# Patient Record
Sex: Female | Born: 1960 | Race: White | Hispanic: No | Marital: Married
Health system: Southern US, Community
[De-identification: ages and names within clinical notes are randomized; demographics above are authoritative.]

---

## 2009-08-29 ENCOUNTER — Emergency Department (HOSPITAL_COMMUNITY): Admission: EM | Admit: 2009-08-29 | Discharge: 2009-08-29 | Payer: Self-pay | Admitting: Emergency Medicine

## 2012-04-06 ENCOUNTER — Telehealth: Payer: Self-pay

## 2012-04-06 NOTE — Telephone Encounter (Signed)
Pt wants to change medicine

## 2015-11-29 DIAGNOSIS — M2550 Pain in unspecified joint: Secondary | ICD-10-CM | POA: Insufficient documentation

## 2015-11-29 DIAGNOSIS — R202 Paresthesia of skin: Secondary | ICD-10-CM | POA: Insufficient documentation

## 2016-04-01 ENCOUNTER — Encounter: Payer: Self-pay | Admitting: Podiatry

## 2016-04-01 ENCOUNTER — Ambulatory Visit (HOSPITAL_BASED_OUTPATIENT_CLINIC_OR_DEPARTMENT_OTHER)
Admission: RE | Admit: 2016-04-01 | Discharge: 2016-04-01 | Disposition: A | Discharge status: Home / Self Care | Payer: BLUE CROSS/BLUE SHIELD | Source: Ambulatory Visit | Attending: Podiatry | Admitting: Podiatry

## 2016-04-01 ENCOUNTER — Ambulatory Visit (INDEPENDENT_AMBULATORY_CARE_PROVIDER_SITE_OTHER): Payer: BLUE CROSS/BLUE SHIELD | Admitting: Podiatry

## 2016-04-01 VITALS — BP 164/97 | HR 64 | Resp 16

## 2016-04-01 DIAGNOSIS — M19072 Primary osteoarthritis, left ankle and foot: Secondary | ICD-10-CM | POA: Diagnosis not present

## 2016-04-01 DIAGNOSIS — M2011 Hallux valgus (acquired), right foot: Secondary | ICD-10-CM | POA: Insufficient documentation

## 2016-04-01 DIAGNOSIS — M19071 Primary osteoarthritis, right ankle and foot: Secondary | ICD-10-CM | POA: Diagnosis not present

## 2016-04-01 DIAGNOSIS — M79671 Pain in right foot: Secondary | ICD-10-CM | POA: Insufficient documentation

## 2016-04-01 DIAGNOSIS — M85871 Other specified disorders of bone density and structure, right ankle and foot: Secondary | ICD-10-CM | POA: Insufficient documentation

## 2016-04-01 DIAGNOSIS — M79672 Pain in left foot: Secondary | ICD-10-CM

## 2016-04-01 DIAGNOSIS — M7989 Other specified soft tissue disorders: Secondary | ICD-10-CM | POA: Diagnosis not present

## 2016-04-01 DIAGNOSIS — M2012 Hallux valgus (acquired), left foot: Secondary | ICD-10-CM | POA: Diagnosis not present

## 2016-04-01 DIAGNOSIS — M2010 Hallux valgus (acquired), unspecified foot: Secondary | ICD-10-CM | POA: Diagnosis not present

## 2016-04-01 NOTE — Progress Notes (Addendum)
Subjective:    Patient ID: Bonnie Suarez, female    DOB: 1960-09-22, 55 y.o.   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 She states the area gets red and painful mostly shoes and she has tried changing her shoes and offloading of the any significant relief in symptoms. She will to discuss surgical invention have the surgery done around Christmas. She denies any numbness or tingling to her toes at this time.  She has a history of rheumatoid arthritis she does not take any medication for this.   Review of Systems  All other systems reviewed and are negative.      Objective:   Physical Exam  General: AAO x3, NAD  Dermatological: Mild erythema to the medial aspect of the first metatarsal head on the right greater than left foot on the bunion site. There is no erythema otherwise or increase in warmth. No fluctuance or crepitus. No signs of infection and this is likely from irritation in shoes. Nails x 10 are well manicured; remaining integument appears unremarkable at this time. There are no open sores, no preulcerative lesions, no rash or signs of infection present.  Vascular: Dorsalis Pedis artery and Posterior Tibial artery pedal pulses are 2/4 bilateral with immedate capillary fill time. Pedal hair growth present. There is no pain with calf compression, swelling, warmth, erythema.   Neruologic: Grossly intact via light touch bilateral. Vibratory intact via tuning fork bilateral. Protective threshold with Semmes Wienstein monofilament intact to all pedal sites bilateral.   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 There is tenderness to palpation on the medial aspect of the first metatarsal head bilaterally right side worse left on the bunion site. Mild decreased range of motion the first MTPJ. There is no crepitation.  No pain to range of motion. No hypermobility is present. There is no other areas of tenderness bilaterally. No area pinpoint tenderness. MMT 5/5.  Gait: Unassisted, Nonantalgic.     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 -I discussed both conservative and surgical treatment options. Continue with shoe gear changes as well as offloading pad the meantime.-She would also like to discuss surgical intervention as she was given proceed with this. After discussion with her discussed with her Liane Comber bunionectomy with possible Akin on the right side. I discussed with the surgery as well as the postoperative course was is not a guarantee relief of symptoms and she understood this and wished to proceed. Discussed that he get in the met adductus she may not get perfect correction. -The incision placement as well as the postoperative course was discussed with the patient. I discussed risks of the surgery which include, but not limited to, infection, bleeding, pain, swelling, need for further surgery, delayed or nonhealing, painful or ugly scar, numbness or sensation changes, over/under correction, recurrence, transfer lesions, further deformity, hardware failure, DVT/PE, loss of toe/foot. Patient understands these risks and wishes to proceed with surgery. The surgical consent was reviewed with the patient all 3 pages were signed. No promises or guarantees were given to the outcome of the procedure. All questions were answered to the best of my ability. Before the surgery the  patient was encouraged to call the office if there is any further questions. The surgery will be performed at the [facility] on an outpatient basis. -CAM boot dispensed to bring to surgery   Celesta Gentile, DPM

## 2016-04-01 NOTE — Patient Instructions (Signed)

## 2016-06-04 ENCOUNTER — Encounter: Payer: Self-pay | Admitting: Podiatry

## 2016-06-04 DIAGNOSIS — M2011 Hallux valgus (acquired), right foot: Secondary | ICD-10-CM | POA: Diagnosis not present

## 2016-06-13 ENCOUNTER — Ambulatory Visit (INDEPENDENT_AMBULATORY_CARE_PROVIDER_SITE_OTHER): Payer: BLUE CROSS/BLUE SHIELD

## 2016-06-13 ENCOUNTER — Ambulatory Visit (INDEPENDENT_AMBULATORY_CARE_PROVIDER_SITE_OTHER): Payer: BLUE CROSS/BLUE SHIELD | Admitting: Podiatry

## 2016-06-13 DIAGNOSIS — M79671 Pain in right foot: Secondary | ICD-10-CM

## 2016-06-13 DIAGNOSIS — M2010 Hallux valgus (acquired), unspecified foot: Secondary | ICD-10-CM

## 2016-06-13 DIAGNOSIS — M21611 Bunion of right foot: Secondary | ICD-10-CM

## 2016-06-13 DIAGNOSIS — M79672 Pain in left foot: Secondary | ICD-10-CM

## 2016-06-13 NOTE — Progress Notes (Signed)
Subjective: Bonnie Suarez is a 55 y.o. is seen today in office s/p right Austin/Akin bunionectomy preformed on 06/04/16. They state their pain is controlled and not taking pain medication. Denies any systemic complaints such as fevers, chills, nausea, vomiting. No calf pain, chest pain, shortness of breath.   Objective: General: No acute distress, AAOx3  DP/PT pulses palpable 2/4, CRT < 3 sec to all digits.  Protective sensation intact. Motor function intact.  Right foot: Incision is well coapted without any evidence of dehiscence and sutures are intact. There is no surrounding erythema, ascending cellulitis, fluctuance, crepitus, malodor, drainage/purulence. There is mild edema around the surgical site. There is minimal pain along the surgical site. Toe is in rectus position. She states "I am just happy to see my toe straight".  No other areas of tenderness to bilateral lower extremities.  No other open lesions or pre-ulcerative lesions.  No pain with calf compression, swelling, warmth, erythema.   Assessment and Plan:  Status post right foot surgery, doing well with no complications   -Treatment options discussed including all alternatives, risks, and complications -X-rays were obtained and reviewed with the patient. Status post bunion correction. Hardware intact. No evidence of acute fracture. -Antibiotic on it was applied followed by a bandage. Keep dressing clean, dry, intact -Continue cam boot. Weightbearing as tolerated. -Hold off on getting incision wet. -Ice/elevation -Pain medication as needed. -Monitor for any clinical signs or symptoms of infection and DVT/PE and directed to call the office immediately should any occur or go to the ER. -Follow-up in 1 week or sooner if any problems arise. In the meantime, encouraged to call the office with any questions, concerns, change in symptoms.   Ovid Curd, DPM

## 2016-06-20 ENCOUNTER — Ambulatory Visit (INDEPENDENT_AMBULATORY_CARE_PROVIDER_SITE_OTHER): Payer: Self-pay | Admitting: Podiatry

## 2016-06-20 ENCOUNTER — Encounter: Payer: Self-pay | Admitting: Podiatry

## 2016-06-20 DIAGNOSIS — Z9889 Other specified postprocedural states: Secondary | ICD-10-CM

## 2016-06-20 DIAGNOSIS — M2011 Hallux valgus (acquired), right foot: Secondary | ICD-10-CM

## 2016-06-20 NOTE — Progress Notes (Signed)
Subjective: Bonnie Suarez is a 56 y.o. is seen today in office s/p right Austin/Akin bunionectomy preformed on 06/04/16. She presents today for suture removal .They state their pain is controlled and not taking pain medication. Denies any systemic complaints such as fevers, chills, nausea, vomiting. No calf pain, chest pain, shortness of breath.   Objective: General: No acute distress, AAOx3  DP/PT pulses palpable 2/4, CRT < 3 sec to all digits.  Protective sensation intact. Motor function intact.  Right foot: Incision is well coapted without any evidence of dehiscence and sutures ends are intact. There is no surrounding erythema, ascending cellulitis, fluctuance, crepitus, malodor, drainage/purulence. There is mild edema around the surgical site however this appears improved. There is no pain along the surgical site. Toe is in rectus position. Mild restriction of MPJ range of motion however there is no pain with range of motion. No other areas of tenderness to bilateral lower extremities.  No other open lesions or pre-ulcerative lesions.  No pain with calf compression, swelling, warmth, erythema.   Assessment and Plan:  Status post right foot surgery, doing well with no complications   -Treatment options discussed including all alternatives, risks, and complications -Suture ends were cut today. Antibiotic ointment was applied followed by a bandage. She can continue this at home. She concerned shower tomorrow. -Continue cam boot. WBAT -Ice/elevation -Pain medication as needed. -Darco splint to help hold the toe straight.  -Monitor for any clinical signs or symptoms of infection and DVT/PE and directed to call the office immediately should any occur or go to the ER. -Follow-up in 2 weeks or sooner if any problems arise. In the meantime, encouraged to call the office with any questions, concerns, change in symptoms.   *likely surgical shoe next appointment. X-ray next appointment   Ovid Curd, DPM

## 2016-06-25 ENCOUNTER — Encounter: Payer: Self-pay | Admitting: Podiatry

## 2016-06-26 ENCOUNTER — Telehealth: Payer: Self-pay | Admitting: *Deleted

## 2016-06-26 NOTE — Telephone Encounter (Addendum)
Pt requested note to return to work and fax to [phone]. 06/26/2016-Left message to call with the information needed to return to work.

## 2016-06-26 NOTE — Telephone Encounter (Signed)
OK to return to work but needs to wear boot and try to ice/elevate as much as possible. Limit walking

## 2016-07-04 ENCOUNTER — Ambulatory Visit (INDEPENDENT_AMBULATORY_CARE_PROVIDER_SITE_OTHER): Payer: BLUE CROSS/BLUE SHIELD

## 2016-07-04 ENCOUNTER — Ambulatory Visit (INDEPENDENT_AMBULATORY_CARE_PROVIDER_SITE_OTHER): Payer: BLUE CROSS/BLUE SHIELD | Admitting: Podiatry

## 2016-07-04 DIAGNOSIS — M2011 Hallux valgus (acquired), right foot: Secondary | ICD-10-CM

## 2016-07-04 DIAGNOSIS — Z9889 Other specified postprocedural states: Secondary | ICD-10-CM

## 2016-07-04 NOTE — Progress Notes (Signed)
Subjective: Shizuye Rupert is a 56 y.o. is seen today in office s/p right Austin/Akin bunionectomy preformed on 06/04/16. She presents today for routine follow-up. She has not been taking any pain medication and she states she is doing well. Denies any systemic complaints such as fevers, chills, nausea, vomiting. No calf pain, chest pain, shortness of breath.   Objective: General: No acute distress, AAOx3  DP/PT pulses palpable 2/4, CRT < 3 sec to all digits.  Protective sensation intact. Motor function intact.  Right foot: Incision is well coapted without any evidence of dehiscence and a scar has formed. There is no surrounding erythema, ascending cellulitis, fluctuance, crepitus, malodor, drainage/purulence. There is minimal edema around the surgical site. There is no pain along the surgical site. Toe is in rectus position. She thinks that "it looks really good".  Improvement in 1st MTPJ ROM. No clinical signs of infection.  No other areas of tenderness to bilateral lower extremities.  No other open lesions or pre-ulcerative lesions.  No pain with calf compression, swelling, warmth, erythema.       Assessment and Plan:  Status post right foot surgery, doing well with no complications   -Treatment options discussed including all alternatives, risks, and complications -X-rays were obtained and reviewed with the patient. Hardware intact. Increased consolidation across the osteotomy sites.  -Continue ROM exercises 1st MTPJ -Transition to a surgical toe today. -She is not taking any pain medication.  -Ice/elevation.  -Continue darco splint.  -Monitor for any clinical signs or symptoms of infection and DVT/PE and directed to call the office immediately should any occur or go to the ER. -Follow-up in 2 weeks or sooner if any problems arise. In the meantime, encouraged to call the office with any questions, concerns, change in symptoms.   *likely regular shoe next appointment. X-ray next  appointment   Ovid Curd, DPM

## 2016-07-14 NOTE — Progress Notes (Signed)
DOS 12.20.2017 Right foot surgical correction of bunion deformity (austin bunionectomy, possible aiken.)

## 2016-07-17 DIAGNOSIS — Z79899 Other long term (current) drug therapy: Secondary | ICD-10-CM | POA: Insufficient documentation

## 2016-07-18 ENCOUNTER — Ambulatory Visit (INDEPENDENT_AMBULATORY_CARE_PROVIDER_SITE_OTHER): Payer: BLUE CROSS/BLUE SHIELD

## 2016-07-18 ENCOUNTER — Ambulatory Visit (INDEPENDENT_AMBULATORY_CARE_PROVIDER_SITE_OTHER): Payer: Self-pay | Admitting: Podiatry

## 2016-07-18 DIAGNOSIS — M21611 Bunion of right foot: Secondary | ICD-10-CM | POA: Diagnosis not present

## 2016-07-18 DIAGNOSIS — M2011 Hallux valgus (acquired), right foot: Secondary | ICD-10-CM

## 2016-07-18 NOTE — Progress Notes (Signed)
Subjective: Bonnie Suarez is a 56 y.o. is seen today in office s/p right Austin/Akin bunionectomy preformed on 06/04/16.She states that she is doing well and this "is the easiest thing I have done". She states she is very happy with the results. She is back to a regular shoe without any issues. She is still working on the big toe ROM and it has been improving.  Denies any systemic complaints such as fevers, chills, nausea, vomiting. No calf pain, chest pain, shortness of breath.   Objective: General: No acute distress, AAOx3  DP/PT pulses palpable 2/4, CRT < 3 sec to all digits.  Protective sensation intact. Motor function intact.  Right foot: Incision is well coapted without any evidence of dehiscence and a scar has formed. There is no surrounding erythema, ascending cellulitis, fluctuance, crepitus, malodor, drainage/purulence. There is minimal edema around the surgical site. There is no pain along the surgical site. Toe is in rectus position. Mild decrease in 1st MTPJ ROM but it is improving.  No other areas of tenderness to bilateral lower extremities.  No other open lesions or pre-ulcerative lesions.  No pain with calf compression, swelling, warmth, erythema.    Assessment and Plan:  Status post right foot surgery, doing well with no complications   -Treatment options discussed including all alternatives, risks, and complications -X-rays were obtained and reviewed with the patient. Hardware intact. Increased consolidation across the osteotomy sites.  -Continue ROM exercises 1st MTPJ -Continue with supportive shoe gear and gradually increase activity.  -Ice/elevation.  -Monitor for any clinical signs or symptoms of infection and DVT/PE and directed to call the office immediately should any occur or go to the ER. -At this time she is doing well and I am going to discharge her from the postoperative care. She would like to have her other foot done this summer. We will see her back in the next  couple of months to schedule this.  -Follow-up in 2 weeks or sooner if any problems arise. In the meantime, encouraged to call the office with any questions, concerns, change in symptoms.   Ovid Curd, DPM

## 2016-11-06 ENCOUNTER — Ambulatory Visit (INDEPENDENT_AMBULATORY_CARE_PROVIDER_SITE_OTHER): Payer: BLUE CROSS/BLUE SHIELD

## 2016-11-06 ENCOUNTER — Encounter: Payer: Self-pay | Admitting: Podiatry

## 2016-11-06 ENCOUNTER — Ambulatory Visit (INDEPENDENT_AMBULATORY_CARE_PROVIDER_SITE_OTHER): Payer: BLUE CROSS/BLUE SHIELD | Admitting: Podiatry

## 2016-11-06 DIAGNOSIS — M21619 Bunion of unspecified foot: Secondary | ICD-10-CM

## 2016-11-06 DIAGNOSIS — M2011 Hallux valgus (acquired), right foot: Secondary | ICD-10-CM | POA: Diagnosis not present

## 2016-11-06 NOTE — Patient Instructions (Signed)

## 2016-11-12 NOTE — Progress Notes (Signed)
Subjective: 56 year old female presents the office today for concerns of possible recurrence of bunion deformity. She previously underwent a bunion correction on June 04, 2016. She states she has not really had any problems with her bunion all this that she is somewhat gray shoe she has noticed a slight prominence on the bunion site. She is happy with the position of the toe but she feels that there is some bone starting to reform that she is concerned about. Denies any systemic complaints such as fevers, chills, nausea, vomiting. No acute changes since last appointment, and no other complaints at this time.   Objective: AAO x3, NAD DP/PT pulses palpable bilaterally, CRT less than 3 seconds Overall there is a very minimal bunion present on the right foot. The incision from the prior surgery is well-healed. Mild dorsal exostosis palpable off of the dorsal first metatarsal head. There is minimal tenderness palpation. There is no pain with first MTPJ range of motion however some mild restriction. No open lesions or pre-ulcerative lesions.  No open lesions or pre-ulcerative lesions. No pain with calf compression, swelling, warmth, erythema  Assessment: Mild recurrence bunion deformity right foot  Plan: -All treatment options discussed with the patient including all alternatives, risks, complications.  -X-rays were obtained and reviewed. Hardware intact. Mild arthritic changes present first MPJ. -I discussed the treatment options including both conservative and surgical treatment options. This point she wishes to proceed with surgery to remove the hardware as well as to shave down the excess bone off the dorsal as well as the medial aspect of the first metatarsal head. I discussed with her the risks of surgery and is also chances can still come back in the future and she understands this and wishes to proceed. -The incision placement as well as the postoperative course was discussed with the patient.  I discussed risks of the surgery which include, but not limited to, infection, bleeding, pain, swelling, need for further surgery, delayed or nonhealing, painful or ugly scar, numbness or sensation changes, over/under correction, recurrence, transfer lesions, further deformity, hardware failure, DVT/PE, loss of toe/foot. Patient understands these risks and wishes to proceed with surgery. The surgical consent was reviewed with the patient all 3 pages were signed. No promises or guarantees were given to the outcome of the procedure. All questions were answered to the best of my ability. Before the surgery the patient was encouraged to call the office if there is any further questions. The surgery will be performed at the [facility] on an outpatient basis. -Patient encouraged to call the office with any questions, concerns, change in symptoms.   Ovid Curd, DPM

## 2016-12-29 ENCOUNTER — Telehealth: Payer: Self-pay | Admitting: *Deleted

## 2016-12-29 NOTE — Telephone Encounter (Signed)
"  I want to see if Dr. Ardelle Anton has any time available at the end of August to schedule surgery."  Yes, he can do it on August 29.  "Can you go ahead and put me down for that date?"  I will get it scheduled.  Someone from the surgical center will give you a call with the arrival time.  You can register with the surgical center.

## 2017-01-07 IMAGING — DX DG FOOT COMPLETE 3+V*R*
3 series · 3 of 3 positions shown · non-contrast
Comparison: None.

CLINICAL DATA: Bunions.  Bilateral first MTP joint pain

EXAM:
RIGHT FOOT COMPLETE - 3+ VIEW; LEFT FOOT - COMPLETE 3+ VIEW

[foot ap]
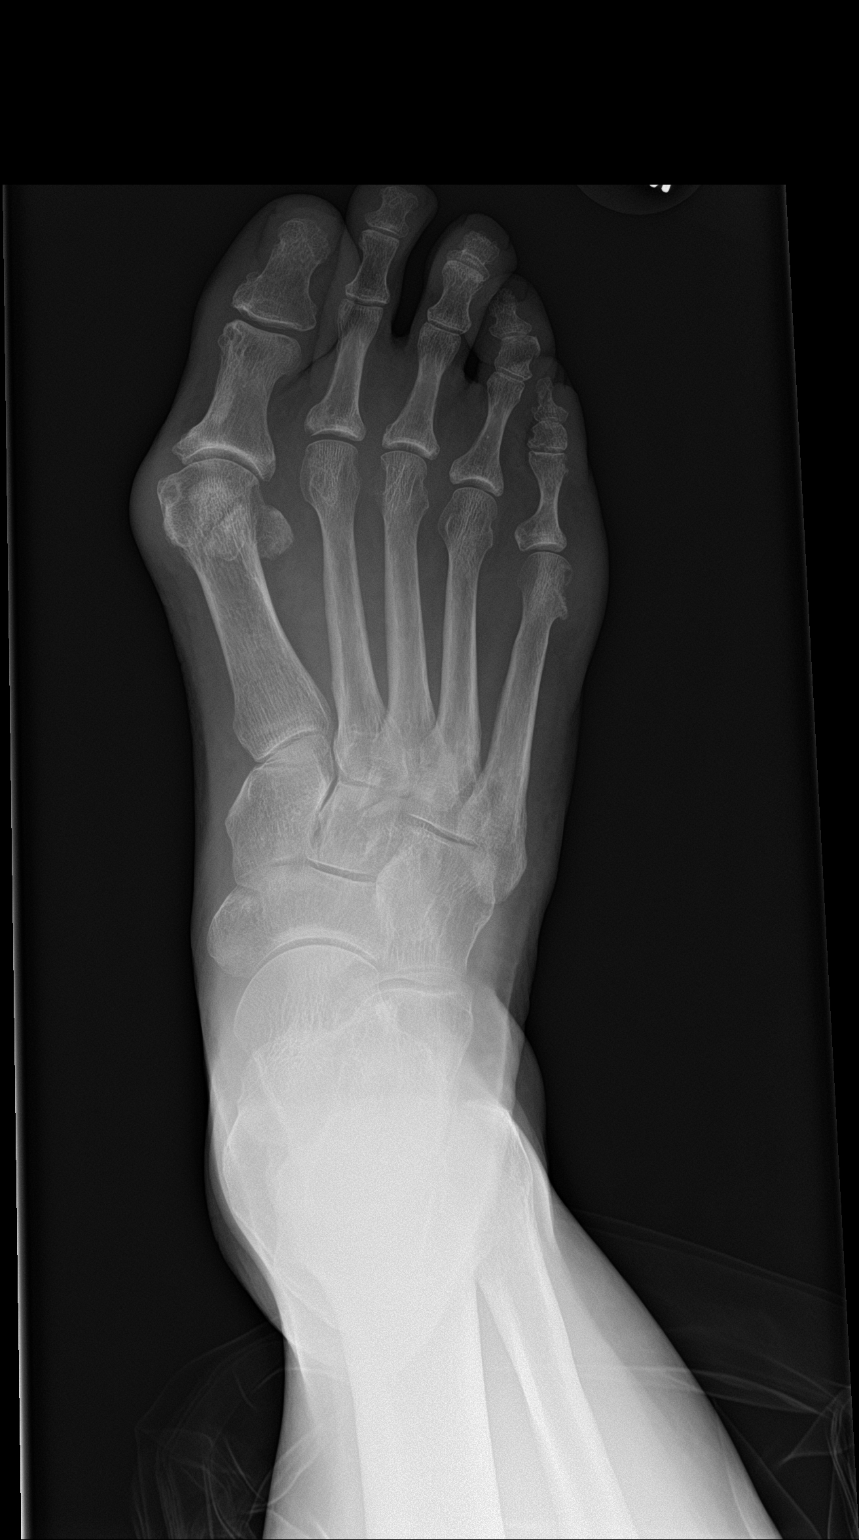

[foot obl]
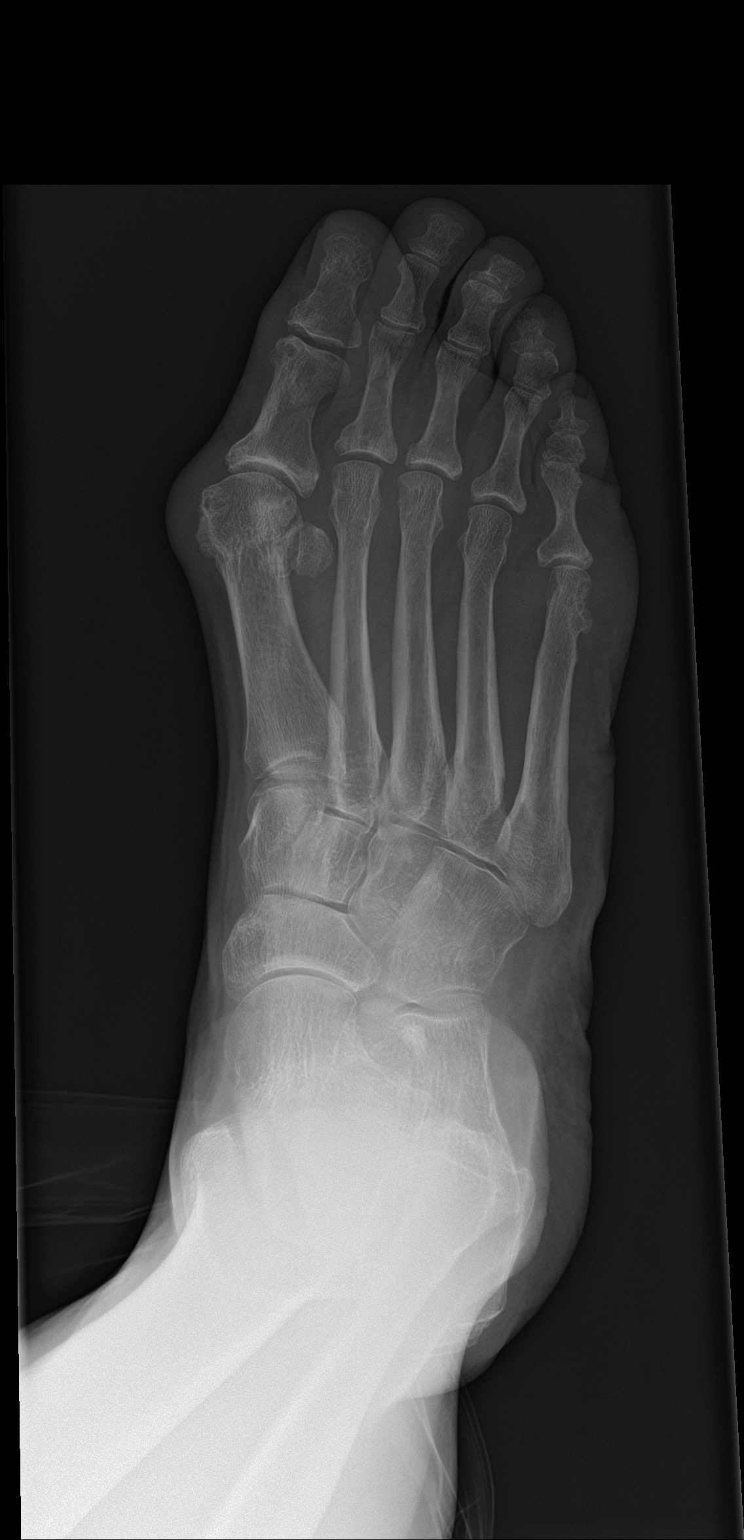

[foot lat]
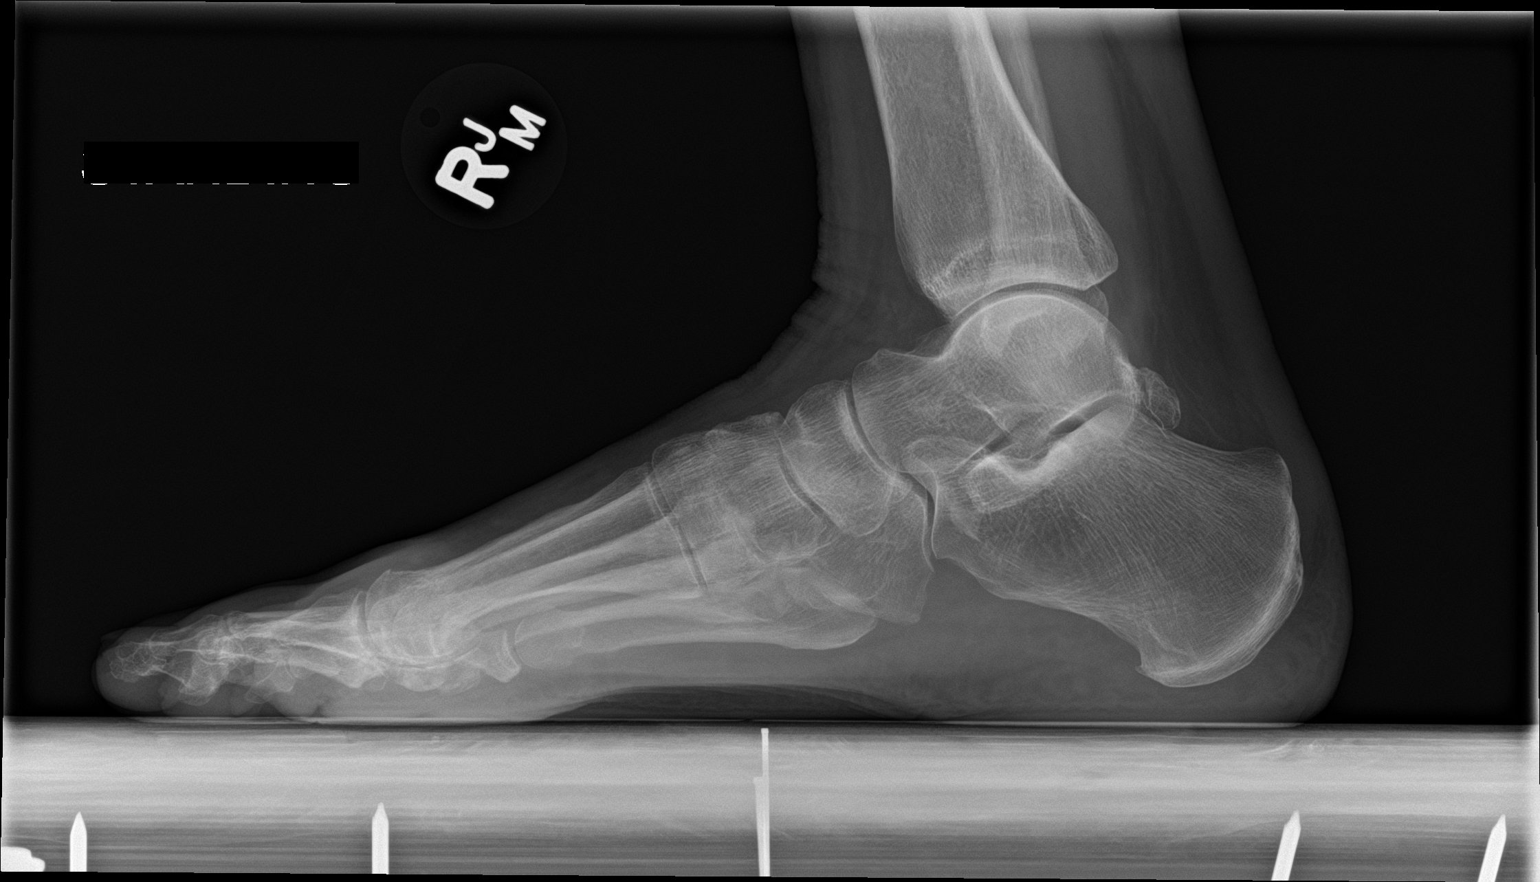

[3 of 3 positions shown; findings below may reference images not displayed]

FINDINGS: Bones: No fracture or dislocation. Normal bone mineralization.
Hallux valgus of bilateral feet, right worse than left.

Joints: Normal alignment. No erosive changes. Mild osteoarthritis of
the first MTP joint bilaterally. Erosion along the lateral aspect of
the right fifth metatarsal with overlying soft tissue swelling.
Possible erosion along the medial aspect of the third right
metatarsal head.

Soft tissue: No soft tissue abnormality. No radiopaque foreign body.
No subcutaneous emphysema.
IMPRESSION: 1. Hallux valgus of bilateral feet with mild osteoarthritis of the
first MTP joints.
2. Erosion along the lateral aspect of the right fifth metatarsal
with overlying soft tissue swelling. Possible erosion along the
medial aspect of the third right metatarsal head. These findings can
be seen with an inflammatory or crystalline arthropathy such as
gout.

## 2017-02-11 ENCOUNTER — Encounter: Payer: Self-pay | Admitting: Podiatry

## 2017-02-11 DIAGNOSIS — M7751 Other enthesopathy of right foot: Secondary | ICD-10-CM | POA: Diagnosis not present

## 2017-02-11 DIAGNOSIS — Z4889 Encounter for other specified surgical aftercare: Secondary | ICD-10-CM | POA: Diagnosis not present

## 2017-02-11 NOTE — Progress Notes (Signed)
Pre-operative Note  Patient presents to the [facility] today for surgical intervention of the right foot for mild reoccurance of bone spur/bunion. The surgical consent was reviewed with the patient and we discussed the procedure as well as the postoperative course. I again discussed all alternatives, risks, complications. I answered all of their questions to the best of my ability and they wish to proceed with surgery. No promises or guarantees were given as to the outcome of the surgery.   Today she does state that after her original bunion surgery she started building a fence 2 days later. She states that she never had any pain after the last surgery and was very active.   The surgical consent was signed.   Patient is NPO since midnight.  The patient does not have have a history of blood clots or bleeding disorders.   No further questions.   Ovid Curd, DPM [facility]

## 2017-02-12 ENCOUNTER — Telehealth: Payer: Self-pay | Admitting: *Deleted

## 2017-02-12 NOTE — Telephone Encounter (Signed)
Called patient and patient answered phone and I stated that I was checking to see how she was doing after surgery and patient stated  that she just got done shopping and that she was doing fine and I asked if any chills or fever and patient stated no and I stated to call the office if any concerns or questions and that we would see patient the following post op visit. Bonnie Suarez

## 2017-02-20 ENCOUNTER — Ambulatory Visit (INDEPENDENT_AMBULATORY_CARE_PROVIDER_SITE_OTHER): Payer: Self-pay | Admitting: Podiatry

## 2017-02-20 ENCOUNTER — Encounter: Payer: BLUE CROSS/BLUE SHIELD | Admitting: Podiatry

## 2017-02-20 ENCOUNTER — Ambulatory Visit (INDEPENDENT_AMBULATORY_CARE_PROVIDER_SITE_OTHER): Payer: BLUE CROSS/BLUE SHIELD

## 2017-02-20 DIAGNOSIS — M21619 Bunion of unspecified foot: Secondary | ICD-10-CM

## 2017-02-20 DIAGNOSIS — Z9889 Other specified postprocedural states: Secondary | ICD-10-CM

## 2017-02-20 NOTE — Progress Notes (Signed)
Subjective: Bonnie Suarez is a 56 y.o. is seen today in office s/p right silver bunionectomy, HWR preformed on 02/11/2017. She states that she's not having any pain. She states that she went shopping immediately after her surgery. She is continuing the CAM boot. Denies any systemic complaints such as fevers, chills, nausea, vomiting. No calf pain, chest pain, shortness of breath.   Objective: General: No acute distress, AAOx3  DP/PT pulses palpable 2/4, CRT < 3 sec to all digits.  Protective sensation intact. Motor function intact.  Right foot: Incision is well coapted without any evidence of dehiscence with sutures intact. There is no surrounding erythema, ascending cellulitis, fluctuance, crepitus, malodor, drainage/purulence. There is minimal edema around the surgical site. There is no pain along the surgical site. There is improved range of motion of the first MPJ and the toe sits in a more rectus position. No other areas of tenderness to bilateral lower extremities.  No other open lesions or pre-ulcerative lesions.  No pain with calf compression, swelling, warmth, erythema.   Assessment and Plan:  Status post right foot surgery, doing well with no complications   -Treatment options discussed including all alternatives, risks, and complications -X-rays were obtained and reviewed with the patient. Status post hardware removal. No evidence of acute fracture identified. -Antibiotic on it was applied followed by a bandage. Keep dressing clean, dry, intact. -Continue cam boot at all times. Recommended continue ice and elevation and limited activity. -Pain medication as needed-she has not been taking any pain meds. -Monitor for any clinical signs or symptoms of infection and DVT/PE and directed to call the office immediately should any occur or go to the ER. -Follow-up in 1 week for POSSIBLE suture removal or sooner if any problems arise. In the meantime, encouraged to call the office with any  questions, concerns, change in symptoms.   Ovid Curd, DPM

## 2017-02-27 ENCOUNTER — Ambulatory Visit (INDEPENDENT_AMBULATORY_CARE_PROVIDER_SITE_OTHER): Payer: BLUE CROSS/BLUE SHIELD

## 2017-02-27 ENCOUNTER — Encounter: Payer: Self-pay | Admitting: Podiatry

## 2017-02-27 ENCOUNTER — Ambulatory Visit (INDEPENDENT_AMBULATORY_CARE_PROVIDER_SITE_OTHER): Payer: BLUE CROSS/BLUE SHIELD | Admitting: Podiatry

## 2017-02-27 VITALS — BP 163/98 | HR 77 | Resp 18

## 2017-02-27 DIAGNOSIS — M21619 Bunion of unspecified foot: Secondary | ICD-10-CM | POA: Diagnosis not present

## 2017-02-27 DIAGNOSIS — Z9889 Other specified postprocedural states: Secondary | ICD-10-CM

## 2017-02-27 NOTE — Progress Notes (Signed)
Subjective: Bonnie Suarez is a 56 y.o. is seen today in office s/p right silver bunionectomy, HWR preformed on 02/11/2017. She states that she is doing well she presents today for possible suture removal. She is wearing a regular shoe on the right foot. She is interested in going him, surgery for left foot. She were to have her left foot bunion corrected in December 2018. She states the left foot is still hurting and she has tried shoe gear modifications, offloading pad into a significant improvement. At this point she requested surgical intervention the left foot.  Objective: General: No acute distress, AAOx3  DP/PT pulses palpable 2/4, CRT < 3 sec to all digits.  Protective sensation intact. Motor function intact.  Right foot: Incision is well coapted without any evidence of dehiscence with sutures intact. There is no surrounding erythema, ascending cellulitis, fluctuance, crepitus, malodor, drainage/purulence. There is mild edema around the surgical site. There is no pain along the surgical site. There is improved range of motion of the first MPJ and the toe sits in a more rectus position. There is no pain and she is doing well. That this may be some mild motion across this incision sites are let the sutures intact. Left foot: Moderate HAV is present. There is no first ray hypermobility present. Is no pain or crepitation first MPJ range of motion. Tenderness along the bunion site. No other area of tenderness. There is mild erythema on medial aspect of the first metatarsal head along the bunion site from irritation issues No other areas of tenderness to bilateral lower extremities.  No other open lesions or pre-ulcerative lesions.  No pain with calf compression, swelling, warmth, erythema.   Assessment and Plan:  Status post right foot surgery, doing well with no complications ; left symptomatic HAV  -Treatment options discussed including all alternatives, risks, and complications  1. Right foot s/p  surgery -There is a mild motion across the incision sites are let seizures intact. Antibiotic ointment was applied followed by a bandage. Daily dressing clean, dry, intact. Continue a surgical shoe. Ice and elevation. She is not taking any pain medication. I recommended her to limit activity level.  2. Left symptomatic HAV -X-rays were obtained and reviewed with the patient. Moderate HAV is present. There is no evidence of acute fracture. -Discussed both conservative as well as surgical treatment options. This point she is attended numerous conservative treatments for any significant improvement. She is requesting surgical intervention at this point. Discussed Bronson, Akin bunionectomy. Risks of the surgery as well as postoperative course. She wishes to go ahead and proceed with surgical intervention. -The incision placement as well as the postoperative course was discussed with the patient. I discussed risks of the surgery which include, but not limited to, infection, bleeding, pain, swelling, need for further surgery, delayed or nonhealing, painful or ugly scar, numbness or sensation changes, over/under correction, recurrence, transfer lesions, further deformity, hardware failure, DVT/PE, loss of toe/foot. Patient understands these risks and wishes to proceed with surgery. The surgical consent was reviewed with the patient all 3 pages were signed. No promises or guarantees were given to the outcome of the procedure. All questions were answered to the best of my ability. Before the surgery the patient was encouraged to call the office if there is any further questions. The surgery will be performed at the [facility] on an outpatient basis.  Ovid Curd, DPM

## 2017-03-06 ENCOUNTER — Ambulatory Visit (INDEPENDENT_AMBULATORY_CARE_PROVIDER_SITE_OTHER): Payer: Self-pay | Admitting: Podiatry

## 2017-03-06 ENCOUNTER — Ambulatory Visit: Payer: BLUE CROSS/BLUE SHIELD

## 2017-03-06 DIAGNOSIS — Z9889 Other specified postprocedural states: Secondary | ICD-10-CM

## 2017-03-06 DIAGNOSIS — M21619 Bunion of unspecified foot: Secondary | ICD-10-CM

## 2017-03-09 NOTE — Progress Notes (Signed)
Subjective: Bonnie Suarez is a 56 y.o. is seen today in office s/p right silver bunionectomy, HWR preformed on 02/11/2017. She presents today for suture removal. She states that she is doing well she's having no pain. She's been wearing a regular shoe and she states that she has been active. Denies any systemic complaints such as fevers, chills, nausea, vomiting. No calf pain, chest pain, shortness of breath.  Objective: General: No acute distress, AAOx3  DP/PT pulses palpable 2/4, CRT < 3 sec to all digits.  Protective sensation intact. Motor function intact.  Right foot: Incision is well coapted without any evidence of dehiscence with sutures intact. There is no surrounding erythema, ascending cellulitis, fluctuance, crepitus, malodor, drainage/purulence. There is mild edema around the surgical site. There is no pain along the surgical site. There is improved range of motion of the first MPJ and the toe sits in a more rectus position. There is no pain and she is doing well. The incision appears to be healing well.  No other areas of tenderness to bilateral lower extremities.  No other open lesions or pre-ulcerative lesions.  No pain with calf compression, swelling, warmth, erythema.   Assessment and Plan:  Status post right foot surgery, doing well with no complications ; left symptomatic HAV  -Treatment options discussed including all alternatives, risks, and complications -Sutures removed today without any complications. After removal the incision made well coapted. Steri-Strips are applied for reinforcement followed by antibiotic ointment and bandage. Continue with in about ointment and ask couple of days and she can start showering get the area wet. As the scar use cocoa butter to help with scarring -Continue with ice and elevation -Follow-up as scheduled or sooner if needed.  Ovid Curd, DPM

## 2017-03-24 NOTE — Progress Notes (Signed)
DOS 02/11/17 Rt foot removal of hardware, shaving of underlying bone

## 2017-05-20 ENCOUNTER — Telehealth: Payer: Self-pay | Admitting: *Deleted

## 2017-05-20 NOTE — Telephone Encounter (Signed)
"  I have surgery scheduled.  I know I have to go and fill out the registry.  I don't know where to go or where to find it.  Call and let me know, that would be great."

## 2017-05-28 NOTE — Telephone Encounter (Signed)
I am returning your call about registering.  "I figured it out and I have taken care of it."

## 2017-06-03 ENCOUNTER — Telehealth: Payer: Self-pay | Admitting: *Deleted

## 2017-06-03 ENCOUNTER — Encounter: Payer: Self-pay | Admitting: Podiatry

## 2017-06-03 ENCOUNTER — Other Ambulatory Visit: Payer: Self-pay | Admitting: Podiatry

## 2017-06-03 DIAGNOSIS — M2012 Hallux valgus (acquired), left foot: Secondary | ICD-10-CM | POA: Diagnosis not present

## 2017-06-03 MED ORDER — PROMETHAZINE HCL 25 MG PO TABS: 25.0000 mg | ORAL_TABLET | Freq: Three times a day (TID) | ORAL | 0 refills | Status: DC | PRN

## 2017-06-03 MED ORDER — OXYCODONE-ACETAMINOPHEN 5-325 MG PO TABS: 1.0000 | ORAL_TABLET | ORAL | 0 refills | Status: DC | PRN

## 2017-06-03 MED ORDER — OXYCODONE-ACETAMINOPHEN 5-325 MG PO TABS: 1.0000 | ORAL_TABLET | Freq: Four times a day (QID) | ORAL | 0 refills | Status: DC | PRN

## 2017-06-03 MED ORDER — CEPHALEXIN 500 MG PO CAPS: 500.0000 mg | ORAL_CAPSULE | Freq: Three times a day (TID) | ORAL | 0 refills | Status: DC

## 2017-06-03 NOTE — Telephone Encounter (Signed)
The pharmacy called and stated that the patient's RX was not at the pharmacy and the patient was waiting and I called the pharmacy and called the Cephalexin 500 mg 21 tablets and to take 3 a day and no refills and then I called the patient to let her know that the RX was at the pharmacy. Misty Stanley

## 2017-06-03 NOTE — Progress Notes (Signed)
Pre-operative Note  Patient presents to the [facility] today for surgical intervention of the LEFT foot for bunion correction. The surgical consent was reviewed with the patient and we discussed the procedure as well as the postoperative course. I again discussed all alternatives, risks, complications. I answered all of their questions to the best of my ability and they wish to proceed with surgery. No promises or guarantees were given as to the outcome of the surgery.   The surgical consent was signed.   Patient is NPO since midnight.  The patient does not have have a history of blood clots or bleeding disorders.   I sent prescriptions to her pharmacy. I sent percocet and keflex.   No further questions.   Ovid Curd, DPM [facility]

## 2017-06-03 NOTE — Progress Notes (Signed)
Resent prescriptions to Bonnie Suarez on [address] in [place]

## 2017-06-04 ENCOUNTER — Telehealth: Payer: Self-pay | Admitting: *Deleted

## 2017-06-04 NOTE — Telephone Encounter (Signed)
Tried to call the patient and could not leave a message due to the mail box was full. Bonnie Suarez

## 2017-06-10 ENCOUNTER — Ambulatory Visit (INDEPENDENT_AMBULATORY_CARE_PROVIDER_SITE_OTHER): Payer: BLUE CROSS/BLUE SHIELD | Admitting: Podiatry

## 2017-06-10 ENCOUNTER — Ambulatory Visit (INDEPENDENT_AMBULATORY_CARE_PROVIDER_SITE_OTHER): Payer: BLUE CROSS/BLUE SHIELD

## 2017-06-10 ENCOUNTER — Encounter: Payer: Self-pay | Admitting: Podiatry

## 2017-06-10 DIAGNOSIS — M21619 Bunion of unspecified foot: Secondary | ICD-10-CM | POA: Diagnosis not present

## 2017-06-10 NOTE — Progress Notes (Signed)
Subjective: Bonnie Suarez is a 56 y.o. is seen today in office s/p left foot Austin bunionectomy preformed on 06/03/2017 the. They state their pain is minimal and she has not been taking any pain medication. She has been wearing the CAM boot. Denies any systemic complaints such as fevers, chills, nausea, vomiting. No calf pain, chest pain, shortness of breath.   Objective: General: No acute distress, AAOx3  DP/PT pulses palpable 2/4, CRT < 3 sec to all digits.  Protective sensation intact. Motor function intact.  LEFT foot: Incision is well coapted without any evidence of dehiscence and sutures are intact. There is no surrounding erythema, ascending cellulitis, fluctuance, crepitus, malodor, drainage/purulence. There is mild edema around the surgical site. There is no pain along the surgical site.  No other areas of tenderness to bilateral lower extremities.  No other open lesions or pre-ulcerative lesions.  No pain with calf compression, swelling, warmth, erythema.   Assessment and Plan:  Status post left foot bunionectomy, doing well with no complications   -Treatment options discussed including all alternatives, risks, and complications -X-rays were obtained and reviewed.  No evidence of acute fracture.  Status post bunionectomy.  Hardware intact.   -Antibiotic ointment was applied followed by a bandage.  Keep the dressing clean, dry, intact -Cam boot at all times -Ice/elevation -Pain medication as needed. -Monitor for any clinical signs or symptoms of infection and DVT/PE and directed to call the office immediately should any occur or go to the ER. -Follow-up in 1 week or sooner if any problems arise. In the meantime, encouraged to call the office with any questions, concerns, change in symptoms.   Ovid Curd, DPM

## 2017-06-18 ENCOUNTER — Ambulatory Visit (INDEPENDENT_AMBULATORY_CARE_PROVIDER_SITE_OTHER): Payer: BLUE CROSS/BLUE SHIELD | Admitting: Podiatry

## 2017-06-18 ENCOUNTER — Encounter: Payer: Self-pay | Admitting: Podiatry

## 2017-06-18 DIAGNOSIS — M21619 Bunion of unspecified foot: Secondary | ICD-10-CM

## 2017-06-19 NOTE — Progress Notes (Signed)
Subjective: Latresa Gasser is a 57 y.o. is seen today in office s/p left foot Austin bunionectomy preformed on 06/03/2017.  She states that she is doing well she has no questions or concerns.  She is not taking any pain medication not having any pain.  She does do quite a bit of walking in her boot. Denies any systemic complaints such as fevers, chills, nausea, vomiting. No calf pain, chest pain, shortness of breath.   Objective: General: No acute distress, AAOx3  DP/PT pulses palpable 2/4, CRT < 3 sec to all digits.  Protective sensation intact. Motor function intact.  LEFT foot: Incision is well coapted without any evidence of dehiscence and sutures are intact. There is no surrounding erythema, ascending cellulitis, fluctuance, crepitus, malodor, drainage/purulence. There is mild however improved edema around the surgical site. There is no pain along the surgical site.  Toes and rectus position and there is no pain or restriction of motion along the MPJ. No other areas of tenderness to bilateral lower extremities.  No other open lesions or pre-ulcerative lesions.  No pain with calf compression, swelling, warmth, erythema.   Assessment and Plan:  Status post left foot bunionectomy, doing well with no complications   -Treatment options discussed including all alternatives, risks, and complications -Incision is healing well.  Antibiotic ointment and a bandage was applied.  She can start to shower in 2 days. If there is any issues with the incision to hold off on showering and call the office. -Continue ice elevation -Cam boot at all times -Pain medication as needed. -Monitor for any clinical signs or symptoms of infection and DVT/PE and directed to call the office immediately should any occur or go to the ER. -Follow-up in 2 week or sooner if any problems arise. In the meantime, encouraged to call the office with any questions, concerns, change in symptoms.   *X-rays next appointment  Ovid Curd, DPM

## 2017-06-22 ENCOUNTER — Telehealth: Payer: Self-pay | Admitting: *Deleted

## 2017-06-22 DIAGNOSIS — M79662 Pain in left lower leg: Secondary | ICD-10-CM

## 2017-06-22 NOTE — Telephone Encounter (Signed)
Pt states she had bunion surgery 3 weeks ago, and has been up and moving , but has begun to have cramping in the back of her calf at night only, no pain in the day, no swelling, no redness.

## 2017-06-22 NOTE — Telephone Encounter (Signed)
If she is having calf pain would get a venous duplex to rule out DVT although unlikely.

## 2017-06-23 ENCOUNTER — Ambulatory Visit (HOSPITAL_COMMUNITY)
Admission: RE | Admit: 2017-06-23 | Discharge: 2017-06-23 | Disposition: A | Discharge status: Home / Self Care | Payer: BLUE CROSS/BLUE SHIELD | Source: Ambulatory Visit | Attending: Vascular Surgery | Admitting: Vascular Surgery

## 2017-06-23 DIAGNOSIS — M79662 Pain in left lower leg: Secondary | ICD-10-CM | POA: Insufficient documentation

## 2017-06-23 NOTE — Telephone Encounter (Signed)
Thanks. Likely due to muscle spasms. She can ice the area. If it gets worse can do flexeril.

## 2017-06-23 NOTE — Telephone Encounter (Signed)
Bonnie Suarez - [facility] scheduled pt for today at 4:00pm and I faxed the orders and informed pt of the time and date. Pt states she has begun to take aspirin and it has eased a little.

## 2017-06-23 NOTE — Telephone Encounter (Signed)
Pearl - [facility] states pt is negative for DVT, pt has been released.

## 2017-06-24 NOTE — Telephone Encounter (Signed)
Unable to leave message, Mailbox is full and can not accept messages at this time.

## 2017-06-24 NOTE — Telephone Encounter (Signed)
Unable to leave a message the mailbox is full and not able to accept any messages at this time.

## 2017-06-25 NOTE — Telephone Encounter (Signed)
Unable to leave a message mailbox is full. 

## 2017-06-26 ENCOUNTER — Telehealth: Payer: Self-pay | Admitting: Podiatry

## 2017-06-26 NOTE — Telephone Encounter (Signed)
Left message informing pt of Dr. Gabriel Rung review of venous doppler results and  06/23/2017 recommendations for treatment, and to call with concerns.

## 2017-06-26 NOTE — Telephone Encounter (Signed)
I was just returning your call. I'm at work but I know we keep playing phone tag. I will do my best to try and answer next time you call me. My number is [phone]. Thanks.

## 2017-07-02 ENCOUNTER — Encounter: Payer: BLUE CROSS/BLUE SHIELD | Admitting: Podiatry

## 2017-07-06 ENCOUNTER — Ambulatory Visit (INDEPENDENT_AMBULATORY_CARE_PROVIDER_SITE_OTHER): Payer: BLUE CROSS/BLUE SHIELD | Admitting: Podiatry

## 2017-07-06 ENCOUNTER — Ambulatory Visit (INDEPENDENT_AMBULATORY_CARE_PROVIDER_SITE_OTHER): Payer: BLUE CROSS/BLUE SHIELD

## 2017-07-06 DIAGNOSIS — M21619 Bunion of unspecified foot: Secondary | ICD-10-CM | POA: Diagnosis not present

## 2017-07-07 NOTE — Progress Notes (Signed)
Subjective: Bonnie Suarez is a 57 y.o. is seen today in office s/p left foot Austin bunionectomy preformed on 06/03/2017.  She states that she is doing well she has no questions or concerns.  No recent injury or trauma.  She has been wearing the cam boot.  She is not taking any pain medication.  Denies any systemic complaints such as fevers, chills, nausea, vomiting. No calf pain, chest pain, shortness of breath.   Objective: General: No acute distress, AAOx3  DP/PT pulses palpable 2/4, CRT < 3 sec to all digits.  Protective sensation intact. Motor function intact.  LEFT foot: Incision is well coapted without any evidence of dehiscence and a single suture is present to the distal aspect of the incision.  There is no surrounding erythema, ascending cellulitis, fluctuance, crepitus, malodor, drainage/purulence. There is minimal edema around the surgical site. There is no pain along the surgical site.  Toes and rectus position and there is no pain or restriction of motion along the MPJ. No other areas of tenderness to bilateral lower extremities.  No other open lesions or pre-ulcerative lesions.  No pain with calf compression, swelling, warmth, erythema.   Assessment and Plan:  Status post left foot bunionectomy, doing well with no complications   -Treatment options discussed including all alternatives, risks, and complications -I was able to remove the single suture today without any complications.  Antibiotic ointment and a Band-Aid was applied.  Otherwise the incision is healed well.  Mild reduction in the first MPJ range of motion on her start some range of motion exercises which we discussed.  We will transition to a surgical shoe which was dispensed today.  Continue ice elevation. -Monitor for any clinical signs or symptoms of infection and DVT/PE and directed to call the office immediately should any occur or go to the ER. -Follow-up in 2-3 week or sooner if any problems arise. In the meantime,  encouraged to call the office with any questions, concerns, change in symptoms.   *X-rays next appointment  Ovid Curd, DPM

## 2017-07-17 NOTE — Progress Notes (Signed)
DOS 12.19.18 Lt foot surgical correction of bunion deformity, Minta Balsam bunionectomy, cutting/repositioning bone w screw fixation

## 2017-07-28 ENCOUNTER — Ambulatory Visit (INDEPENDENT_AMBULATORY_CARE_PROVIDER_SITE_OTHER): Payer: BLUE CROSS/BLUE SHIELD | Admitting: Podiatry

## 2017-07-28 ENCOUNTER — Ambulatory Visit (INDEPENDENT_AMBULATORY_CARE_PROVIDER_SITE_OTHER): Payer: BLUE CROSS/BLUE SHIELD

## 2017-07-28 ENCOUNTER — Encounter: Payer: Self-pay | Admitting: Podiatry

## 2017-07-28 DIAGNOSIS — M21619 Bunion of unspecified foot: Secondary | ICD-10-CM

## 2017-08-02 NOTE — Progress Notes (Signed)
Subjective: Bonnie Suarez is a 57 y.o. is seen today in office s/p left foot Austin bunionectomy preformed on 06/03/2017.  She states that she is doing better.  She was a surgical shoe for couple of days but she states that caused more pain and discomfort to where it she went back into regular shoe.  She is back to wearing a regular shoe without any problems she is able to walk without any pain and she has had very minimal swelling and overall she states that she is doing well she has no concerns and she is happy with the outcome of the surgery. Denies any systemic complaints such as fevers, chills, nausea, vomiting. No calf pain, chest pain, shortness of breath.   Objective: General: No acute distress, AAOx3  DP/PT pulses palpable 2/4, CRT < 3 sec to all digits.  Protective sensation intact. Motor function intact.  LEFT foot: Incision is well coapted without any evidence of dehiscence and the scar is well formed.  There is minimal edema to the first MPJ and along the surgical site on the left foot.  Mild restriction first MTP range of motion however this is improved compared to last appointment.  There is no pain or crepitation with MPJ range of motion there is no area of tenderness to the surgical site.  Toes and rectus position she is pleased with the position of the toe at this point.  There is no other area tenderness identified bilaterally. No other open lesions or pre-ulcerative lesions.  No pain with calf compression, swelling, warmth, erythema.   Assessment and Plan:  Status post left foot bunionectomy, doing well with no complications   -Treatment options discussed including all alternatives, risks, and complications -X-rays were obtained and reviewed.  Hardware intact from prior bunion surgery and there is increased consolidation across the osteotomy site.  No evidence of acute fracture identified. -Also to continue with range of motion exercises of the first MPJ.  We discussed physical  therapy which she wishes to hold off on this.  She is back to regular shoe without any problems.  At this point as she is having no issues are to see her back as needed and was discharged from the postoperative course I discussed with her that if any point there is any concerns or questions or any pain to call the office I be more than happy to see her for this.  She said no issues with the postoperative course and she was examined other foot I think she will do well.  Vivi Barrack DPM

## 2018-03-19 DIAGNOSIS — M24549 Contracture, unspecified hand: Secondary | ICD-10-CM | POA: Insufficient documentation

## 2018-03-19 DIAGNOSIS — M069 Rheumatoid arthritis, unspecified: Secondary | ICD-10-CM | POA: Insufficient documentation

## 2018-04-19 DIAGNOSIS — G5601 Carpal tunnel syndrome, right upper limb: Secondary | ICD-10-CM | POA: Insufficient documentation

## 2018-11-12 ENCOUNTER — Other Ambulatory Visit: Payer: Self-pay

## 2018-11-12 ENCOUNTER — Ambulatory Visit: Payer: BLUE CROSS/BLUE SHIELD | Admitting: Podiatry

## 2018-11-12 ENCOUNTER — Ambulatory Visit (INDEPENDENT_AMBULATORY_CARE_PROVIDER_SITE_OTHER): Payer: BLUE CROSS/BLUE SHIELD

## 2018-11-12 ENCOUNTER — Telehealth: Payer: Self-pay | Admitting: *Deleted

## 2018-11-12 ENCOUNTER — Encounter: Payer: Self-pay | Admitting: Podiatry

## 2018-11-12 VITALS — Temp 97.9°F

## 2018-11-12 DIAGNOSIS — S99921A Unspecified injury of right foot, initial encounter: Secondary | ICD-10-CM | POA: Diagnosis not present

## 2018-11-12 DIAGNOSIS — S92401A Displaced unspecified fracture of right great toe, initial encounter for closed fracture: Secondary | ICD-10-CM | POA: Diagnosis not present

## 2018-11-12 DIAGNOSIS — S91111A Laceration without foreign body of right great toe without damage to nail, initial encounter: Secondary | ICD-10-CM

## 2018-11-12 MED ORDER — MUPIROCIN 2 % EX OINT: 1.0000 "application " | TOPICAL_OINTMENT | Freq: Two times a day (BID) | CUTANEOUS | 2 refills | Status: AC

## 2018-11-12 MED ORDER — CEPHALEXIN 500 MG PO CAPS: 500.0000 mg | ORAL_CAPSULE | Freq: Three times a day (TID) | ORAL | 0 refills | Status: AC

## 2018-11-12 NOTE — Telephone Encounter (Signed)
Pt states she dropped a heavy object on her surgery foot and would like to be seen today for an x-ray thinks she broke her toe. Transferred to schedulers.

## 2018-11-14 NOTE — Progress Notes (Signed)
Subjective: 58 year old female presents the office today for an acute appointment.  She states that she was trying to hang a decorative had a metal grate on the wall and fell hitting her toe.  When she took her shoe off she noticed a lot of blood and swelling to the toe.  She is not sure when her last tetanus shot was.  She had no recent treatment.  No other injury.  Denies any systemic complaints such as fevers, chills, nausea, vomiting. No acute changes since last appointment, and no other complaints at this time.   Objective: AAO x3, NAD DP/PT pulses palpable bilaterally, CRT less than 3 seconds There is moderate edema to the hallux and there is a transverse superficial laceration present on to the toenail.  There is no active bleeding present at this time.  There is quite a bit of tenderness to the toe as well as the MPJ.  She states that prior to this injury she states the toe is doing fine she was having no pain or issues. No open lesions or pre-ulcerative lesions.  No pain with calf compression, swelling, warmth, erythema  Assessment: Crush injury right hallux resulting in wound, likely fracture  Plan: -All treatment options discussed with the patient including all alternatives, risks, complications.  -X-rays were obtained reviewed.  Compared to the x-rays from 2018 there is no significant change.  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  This could represent AVN versus injury.  We discussed surgical intervention but given the acute injury and swelling we will hold surgical intervention.  She is been read by the [facility] when she leaves to get a tetanus update. -Keflex.  Antibiotic ointment and a bandage on the wound daily.  This was superficial there is no sutures placed. -Surgical Shoe -Monitor for any clinical signs or symptoms of infection and directed to call the office immediately should any occur or go  to the ER.   Vivi Barrack DPM

## 2018-11-29 ENCOUNTER — Other Ambulatory Visit: Payer: Self-pay

## 2018-11-29 ENCOUNTER — Ambulatory Visit: Payer: BC Managed Care – PPO | Admitting: Podiatry
